# Patient Record
Sex: Female | Born: 1995 | Race: White | Hispanic: No | Marital: Single | Smoking: Current every day smoker
Health system: Southern US, Community
[De-identification: ages and names within clinical notes are randomized; demographics above are authoritative.]

---

## 2017-03-01 ENCOUNTER — Emergency Department (HOSPITAL_COMMUNITY)
Admission: EM | Admit: 2017-03-01 | Discharge: 2017-03-01 | Disposition: A | Discharge status: Home / Self Care | Payer: Self-pay | Attending: Emergency Medicine | Admitting: Emergency Medicine

## 2017-03-01 ENCOUNTER — Encounter (HOSPITAL_COMMUNITY): Payer: Self-pay | Admitting: Emergency Medicine

## 2017-03-01 ENCOUNTER — Emergency Department (HOSPITAL_COMMUNITY): Payer: Self-pay

## 2017-03-01 DIAGNOSIS — F17210 Nicotine dependence, cigarettes, uncomplicated: Secondary | ICD-10-CM | POA: Insufficient documentation

## 2017-03-01 DIAGNOSIS — K0889 Other specified disorders of teeth and supporting structures: Secondary | ICD-10-CM | POA: Insufficient documentation

## 2017-03-01 DIAGNOSIS — M25531 Pain in right wrist: Secondary | ICD-10-CM | POA: Insufficient documentation

## 2017-03-01 MED ORDER — IBUPROFEN 200 MG PO TABS
600.0000 mg | ORAL_TABLET | Freq: Once | ORAL | Status: AC
  Administered 2017-03-01: 600 mg via ORAL
  Filled 2017-03-01: qty 3

## 2017-03-01 MED ORDER — PENICILLIN V POTASSIUM 500 MG PO TABS: 500.0000 mg | ORAL_TABLET | Freq: Four times a day (QID) | ORAL | 0 refills | Status: AC

## 2017-03-01 NOTE — ED Notes (Signed)
Ortho coming to splint wrist

## 2017-03-01 NOTE — ED Provider Notes (Signed)
[facility]-EMERGENCY DEPT Provider Note   CSN: [id number] Arrival date & time: 03/01/17  0847     History   Chief Complaint Chief Complaint  Patient presents with  . Wrist Pain    right   . Dental Pain    HPI  Natalie Perkins is a 22 y.o. Female who is otherwise healthy, presents to the ED complaining of right wrist pain, which has been worsening recently, but is been present over the last 3 years.  Patient reports she feels like she has a bony prominence over the dorsal aspect of her wrist, that causes pain, at night and especially when she is working 12-hour shifts that she works a lot with her hands.  Patient reports she was evaluated once previously 3 years ago, never had any x-rays done, but wore a brace intermittently for a while.  Patient denies any new injury to the wrist.  Denies any numbness or going.  Patient also reports she has had a broken tooth on her left lower jaw for the past several years, has had infections in the past, and feels like she is getting infection now, she has had worsening pain and some intermittent swelling over the left lower jaw.  Patient denies any fevers or chills.  Reports she does not have a dentist and has been trying to save up to have this tooth pulled.  Denies any pain or swelling under the tongue.      History reviewed. No pertinent past medical history.  There are no active problems to display for this patient.   History reviewed. No pertinent surgical history.  OB History    No data available       Home Medications    Prior to Admission medications   Medication Sig Start Date End Date Taking? Authorizing Provider  ibuprofen (ADVIL,MOTRIN) 200 MG tablet Take 200 mg by mouth every 6 (six) hours as needed for moderate pain.   Yes [provider]  penicillin v potassium (VEETID) 500 MG tablet Take 1 tablet (500 mg total) by mouth 4 (four) times daily for 7 days. 03/01/17 03/08/17  Dartha Lodge, PA-C      Family History No family history on file.  Social History Social History   Tobacco Use  . Smoking status: Current Every Day Smoker    Types: Cigarettes  . Smokeless tobacco: Never Used  Substance Use Topics  . Alcohol use: Yes    Comment: occasional   . Drug use: Not on file     Allergies   Patient has no known allergies.   Review of Systems Review of Systems  Constitutional: Negative for chills and fever.  HENT: Positive for dental problem and facial swelling. Negative for sore throat and trouble swallowing.   Eyes: Negative for pain.  Respiratory: Negative for shortness of breath and stridor.   Gastrointestinal: Negative for nausea and vomiting.  Musculoskeletal: Positive for arthralgias (R wrist). Negative for joint swelling.     Physical Exam Updated Vital Signs BP 132/86   Pulse 77   Temp 97.9 F (36.6 C) (Oral)   Resp 16   LMP 02/01/2017 (Approximate)   SpO2 100%   Physical Exam  Constitutional: She appears well-developed and well-nourished. No distress.  HENT:  Head: Normocephalic and atraumatic.  Teeth are in somewhat poor dentition, most posterior molar over the left lower jaw is noted to be broken, with some surrounding erythema of the gums, no obvious abscess present, no sublingual swelling or tenderness,  posterior oropharynx clear, no facial swelling noted today, some mild tenderness with palpation over the left lower jaw  Eyes: Right eye exhibits no discharge. Left eye exhibits no discharge.  Neck: Neck supple.  No tenderness to palpation, no stridor on auscultation  Pulmonary/Chest: Effort normal. No respiratory distress.  Musculoskeletal:  Right wrist with mild tenderness to palpation over the dorsal aspect, small deformity palpated over the middle of the dorsal aspect of the wrist, does not feel cystic in nature normal range of motion with some discomfort, no pain in the hand, 2+ radial pulse and good capillary refill, sensation intact, normal  motor function with 5/5 grip strength  Neurological: She is alert. Coordination normal.  Skin: Skin is warm and dry. Capillary refill takes less than 2 seconds. She is not diaphoretic.  Psychiatric: She has a normal mood and affect. Her behavior is normal.  Nursing note and vitals reviewed.    ED Treatments / Results  Labs (all labs ordered are listed, but only abnormal results are displayed) Labs Reviewed - No data to display  EKG  EKG Interpretation None       Radiology Dg Wrist Complete Right  Result Date: 03/01/2017 CLINICAL DATA:  Soft tissue lesion along the posterior wrist, initial encounter, no known injury EXAM: RIGHT WRIST - COMPLETE 3+ VIEW COMPARISON:  None. FINDINGS: There is no evidence of fracture or dislocation. There is no evidence of arthropathy or other focal bone abnormality. Soft tissues are unremarkable. IMPRESSION: No acute abnormality noted. The palpable abnormality is not well appreciated on this exam. Electronically Signed   By: Alcide CleverMark  Lukens M.D.   On: 03/01/2017 10:03    Procedures Procedures (including critical care time)  Medications Ordered in ED Medications  ibuprofen (ADVIL,MOTRIN) tablet 600 mg (600 mg Oral Given 03/01/17 [id number])     Initial Impression / Assessment and Plan / ED Course  I have reviewed the triage vital signs and the nursing notes.  Pertinent labs & imaging results that were available during my care of the patient were reviewed by me and considered in my medical decision making (see chart for details).  Patient presents for evaluation of right wrist pain and dental pain.  Right wrist pain has been going on for 3 years, but acutely worsened recently, patient has never had imaging, will get x-ray of the wrist, wrist is neurovascularly intact with normal active range of motion.  X-ray normal, will place patient in wrist brace and have her follow-up outpatient.  Patient also complaining of dental pain.  Broken tooth noted on the left  lower jaw. No gross abscess.  Exam unconcerning for Ludwig's angina or spread of infection.  Will treat with penicillin and anti-inflammatories medicine.  Urged patient to follow-up with dentist, dental resources provided.  Will also have patient follow-up with [facility].   Final Clinical Impressions(s) / ED Diagnoses   Final diagnoses:  Right wrist pain  Pain, dental    ED Discharge Orders        Ordered    penicillin v potassium (VEETID) 500 MG tablet  4 times daily     03/01/17 0931       Dartha LodgeFord, Kelsey N, PA-C 03/01/17 1015    Gerhard MunchLockwood, Robert, MD 03/01/17 1556

## 2017-03-01 NOTE — ED Triage Notes (Signed)
Patient c/o right wrist pain for over 3 years that has continuously bothering her esp when working 12 hr shifts. Patient also c/o left lower dental pain on broken tooth.

## 2017-03-01 NOTE — Discharge Instructions (Signed)
Please complete course of antibiotics as directed.  Ibuprofen and Tylenol for pain.  It is very important that you follow-up with a dentist, please use the resources provided.  As for your wrist pain please wear a brace, especially at work and at night, you may use ice and heat as well.  If pain is not improving please follow-up with the [facility].

## 2019-01-10 IMAGING — CR DG WRIST COMPLETE 3+V*R*
4 series · 4 of 4 positions shown · non-contrast
Comparison: None.

CLINICAL DATA: Soft tissue lesion along the posterior wrist,
initial encounter, no known injury

EXAM:
RIGHT WRIST - COMPLETE 3+ VIEW

[x wrist pa right]
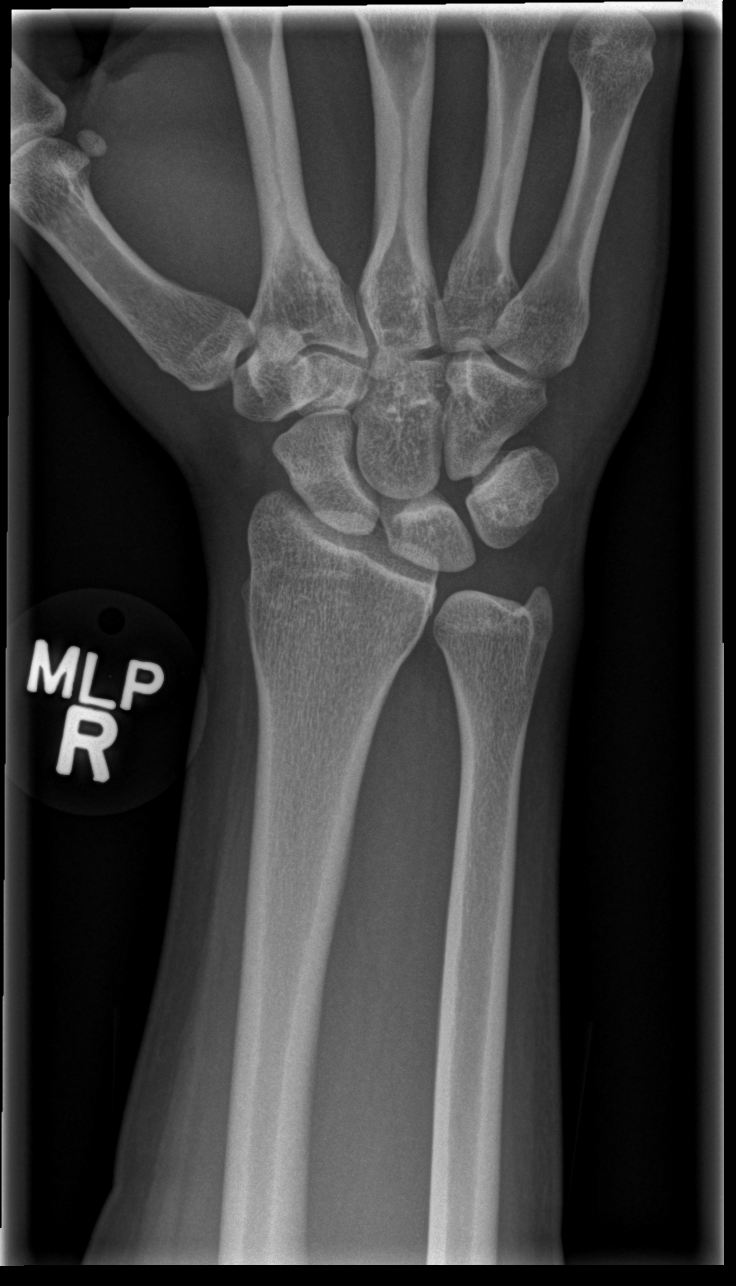

[x wrist obl right]
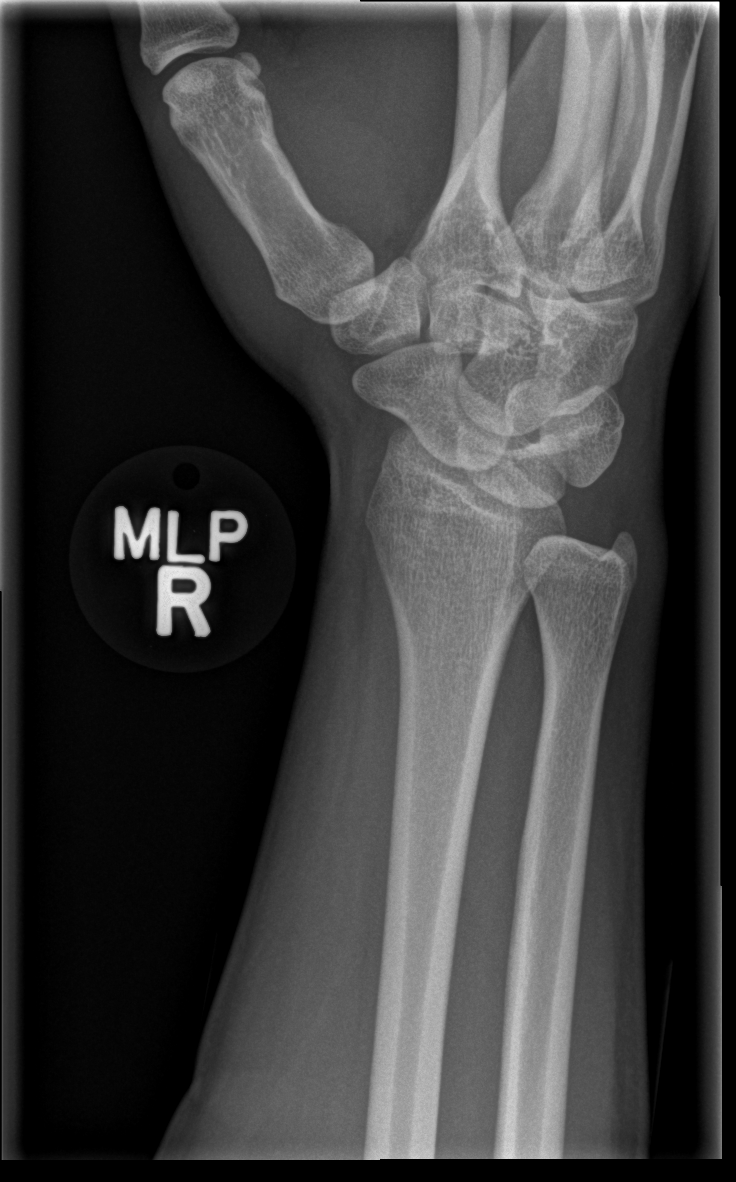

[x wrist lat right]
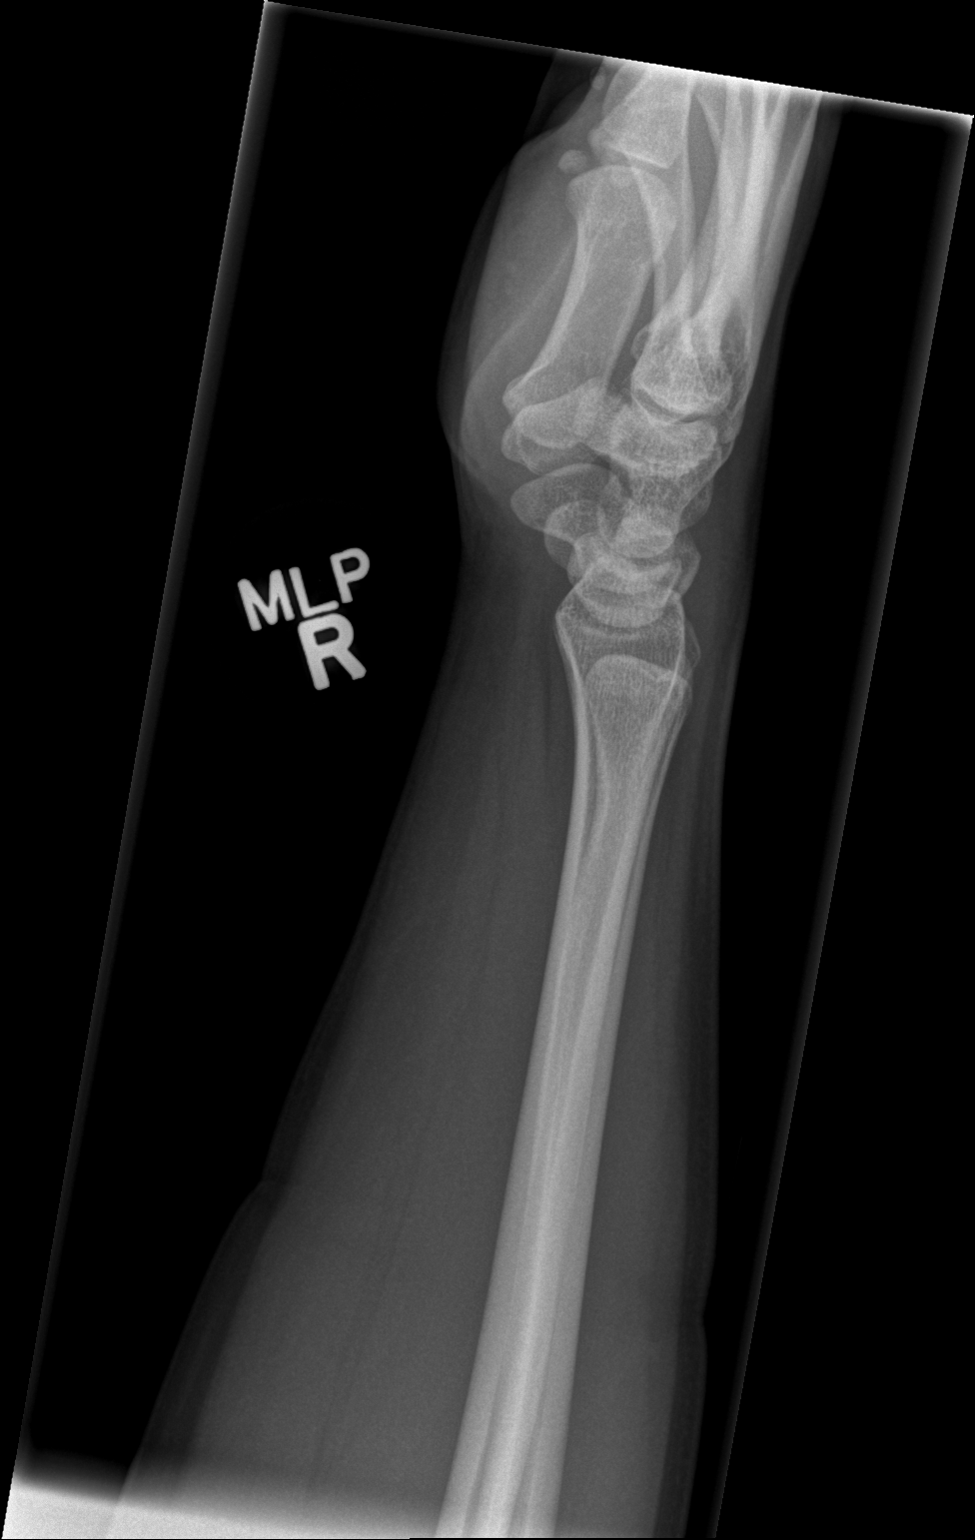

[x wrist navicular view right]
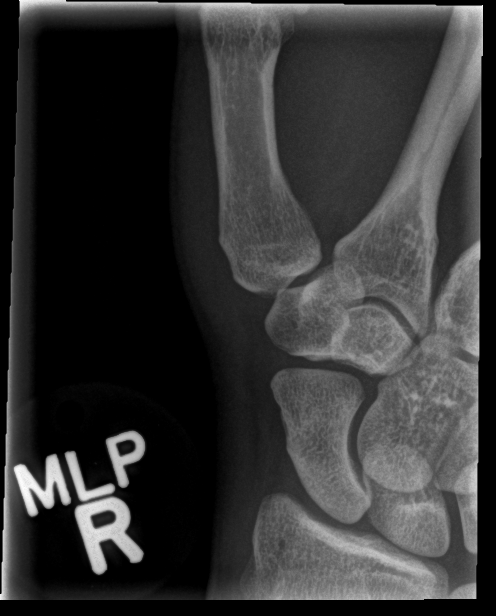

[4 of 4 positions shown; findings below may reference images not displayed]

FINDINGS: There is no evidence of fracture or dislocation. There is no
evidence of arthropathy or other focal bone abnormality. Soft
tissues are unremarkable.
IMPRESSION: No acute abnormality noted. The palpable abnormality is not well
appreciated on this exam.
